# Patient Record
Sex: Male | Born: 1950 | Race: White | Hispanic: No | Marital: Married
Health system: Midwestern US, Academic
[De-identification: ages and names within clinical notes are randomized; demographics above are authoritative.]

---

## 2017-09-14 ENCOUNTER — Encounter: Admit: 2017-09-14 | Discharge: 2017-09-14 | Payer: MEDICARE | Primary: Family

## 2017-09-14 DIAGNOSIS — I10 Essential (primary) hypertension: ICD-10-CM

## 2017-09-14 DIAGNOSIS — I2510 Atherosclerotic heart disease of native coronary artery without angina pectoris: ICD-10-CM

## 2017-09-14 DIAGNOSIS — R51 Headache: Principal | ICD-10-CM

## 2017-09-14 DIAGNOSIS — G629 Polyneuropathy, unspecified: ICD-10-CM

## 2017-09-14 DIAGNOSIS — J449 Chronic obstructive pulmonary disease, unspecified: ICD-10-CM

## 2017-09-14 DIAGNOSIS — R42 Dizziness and giddiness: ICD-10-CM

## 2017-09-14 DIAGNOSIS — M1990 Unspecified osteoarthritis, unspecified site: ICD-10-CM

## 2017-09-14 DIAGNOSIS — F17200 Nicotine dependence, unspecified, uncomplicated: ICD-10-CM

## 2017-09-14 DIAGNOSIS — H538 Other visual disturbances: ICD-10-CM

## 2017-09-14 DIAGNOSIS — K219 Gastro-esophageal reflux disease without esophagitis: ICD-10-CM

## 2017-09-14 DIAGNOSIS — J302 Other seasonal allergic rhinitis: ICD-10-CM

## 2018-03-06 ENCOUNTER — Encounter: Admit: 2018-03-06 | Discharge: 2018-03-06 | Payer: MEDICARE | Primary: Family

## 2018-03-06 DIAGNOSIS — I2510 Atherosclerotic heart disease of native coronary artery without angina pectoris: Principal | ICD-10-CM

## 2018-03-13 ENCOUNTER — Encounter: Admit: 2018-03-13 | Discharge: 2018-03-13 | Payer: MEDICARE | Primary: Family

## 2018-03-13 ENCOUNTER — Ambulatory Visit: Admit: 2018-03-13 | Discharge: 2018-03-14 | Payer: MEDICARE | Primary: Family

## 2018-03-13 DIAGNOSIS — I10 Essential (primary) hypertension: ICD-10-CM

## 2018-03-13 DIAGNOSIS — R51 Headache: Principal | ICD-10-CM

## 2018-03-13 DIAGNOSIS — J449 Chronic obstructive pulmonary disease, unspecified: ICD-10-CM

## 2018-03-13 DIAGNOSIS — R079 Chest pain, unspecified: ICD-10-CM

## 2018-03-13 DIAGNOSIS — M1990 Unspecified osteoarthritis, unspecified site: ICD-10-CM

## 2018-03-13 DIAGNOSIS — R0989 Other specified symptoms and signs involving the circulatory and respiratory systems: ICD-10-CM

## 2018-03-13 DIAGNOSIS — R200 Anesthesia of skin: ICD-10-CM

## 2018-03-13 DIAGNOSIS — H538 Other visual disturbances: ICD-10-CM

## 2018-03-13 DIAGNOSIS — E7800 Pure hypercholesterolemia, unspecified: Principal | ICD-10-CM

## 2018-03-13 DIAGNOSIS — J432 Centrilobular emphysema: ICD-10-CM

## 2018-03-13 DIAGNOSIS — I2510 Atherosclerotic heart disease of native coronary artery without angina pectoris: ICD-10-CM

## 2018-03-13 DIAGNOSIS — G629 Polyneuropathy, unspecified: ICD-10-CM

## 2018-03-13 DIAGNOSIS — J302 Other seasonal allergic rhinitis: ICD-10-CM

## 2018-03-13 DIAGNOSIS — I25119 Atherosclerotic heart disease of native coronary artery with unspecified angina pectoris: ICD-10-CM

## 2018-03-13 DIAGNOSIS — F17200 Nicotine dependence, unspecified, uncomplicated: ICD-10-CM

## 2018-03-13 DIAGNOSIS — I739 Peripheral vascular disease, unspecified: ICD-10-CM

## 2018-03-13 DIAGNOSIS — R42 Dizziness and giddiness: ICD-10-CM

## 2018-03-13 DIAGNOSIS — Z720 Tobacco use: ICD-10-CM

## 2018-03-13 DIAGNOSIS — M79609 Pain in unspecified limb: ICD-10-CM

## 2018-03-13 DIAGNOSIS — K219 Gastro-esophageal reflux disease without esophagitis: ICD-10-CM

## 2018-03-14 LAB — LIPID PROFILE
Lab: 152 FL (ref 7–11)
Lab: 19 % (ref 0–5)
Lab: 3 K/UL (ref 1.8–7.0)
Lab: 47 % (ref >60)
Lab: 91 % (ref 4–12)
Lab: 97 % (ref >60)

## 2018-03-15 ENCOUNTER — Encounter: Admit: 2018-03-15 | Discharge: 2018-03-15 | Payer: MEDICARE | Primary: Family

## 2018-03-15 DIAGNOSIS — E7800 Pure hypercholesterolemia, unspecified: Principal | ICD-10-CM

## 2018-03-20 ENCOUNTER — Ambulatory Visit: Admit: 2018-03-20 | Discharge: 2018-03-20 | Payer: MEDICARE | Primary: Family

## 2018-03-20 DIAGNOSIS — M79609 Pain in unspecified limb: ICD-10-CM

## 2018-03-20 DIAGNOSIS — R0989 Other specified symptoms and signs involving the circulatory and respiratory systems: ICD-10-CM

## 2018-03-20 DIAGNOSIS — I25119 Atherosclerotic heart disease of native coronary artery with unspecified angina pectoris: ICD-10-CM

## 2018-03-20 DIAGNOSIS — I739 Peripheral vascular disease, unspecified: Secondary | ICD-10-CM

## 2018-03-20 DIAGNOSIS — E7800 Pure hypercholesterolemia, unspecified: Principal | ICD-10-CM

## 2018-03-21 ENCOUNTER — Encounter: Admit: 2018-03-21 | Discharge: 2018-03-21 | Payer: MEDICARE | Primary: Family

## 2018-03-21 DIAGNOSIS — I25119 Atherosclerotic heart disease of native coronary artery with unspecified angina pectoris: ICD-10-CM

## 2018-03-21 DIAGNOSIS — Z720 Tobacco use: ICD-10-CM

## 2018-03-21 DIAGNOSIS — Z01818 Encounter for other preprocedural examination: ICD-10-CM

## 2018-03-21 DIAGNOSIS — J439 Emphysema, unspecified: Principal | ICD-10-CM

## 2018-03-21 DIAGNOSIS — E7800 Pure hypercholesterolemia, unspecified: ICD-10-CM

## 2018-03-21 MED ORDER — ATORVASTATIN 40 MG PO TAB
40 mg | ORAL_TABLET | Freq: Every day | ORAL | 3 refills | Status: AC
Start: 2018-03-21 — End: ?

## 2018-05-16 ENCOUNTER — Encounter: Admit: 2018-05-16 | Discharge: 2018-05-16 | Payer: MEDICARE | Primary: Family

## 2018-05-16 DIAGNOSIS — E7800 Pure hypercholesterolemia, unspecified: ICD-10-CM

## 2018-05-16 DIAGNOSIS — I25119 Atherosclerotic heart disease of native coronary artery with unspecified angina pectoris: Principal | ICD-10-CM

## 2018-06-15 ENCOUNTER — Encounter: Admit: 2018-06-15 | Discharge: 2018-06-15 | Payer: MEDICARE | Primary: Family

## 2018-10-30 ENCOUNTER — Encounter: Admit: 2018-10-30 | Discharge: 2018-10-31 | Payer: MEDICARE | Primary: Family

## 2018-10-31 DIAGNOSIS — J069 Acute upper respiratory infection, unspecified: Principal | ICD-10-CM

## 2018-10-31 LAB — COVID-19 (SARS-COV-2) PCR

## 2019-09-28 ENCOUNTER — Encounter: Admit: 2019-09-28 | Discharge: 2019-09-28 | Payer: MEDICARE | Primary: Family

## 2019-09-29 ENCOUNTER — Encounter: Admit: 2019-09-29 | Discharge: 2019-09-29 | Payer: MEDICARE | Primary: Family

## 2019-09-29 NOTE — Progress Notes
69 yo M, presented to outside ED this evening after sustaining an injury to his left index finger and left thumb.  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  Provider reports that it went through the middle phalange completely but that there is some vascular flow.  Decent cap refill distally.  Patient sustained a tuft fracture to left thumb as well.  He is alert and oriented.  Refusing pain medications.  Tetanus given.      Dr. Jen Mow after consulting with provider, recommended stitching up the index finger and placing him a splint.  He asked them to get a covid swab on him as well.  Sending is to send patient contact info that I will in turn forward on to Dr. Acie Fredrickson so that his office can contact him this week to set up surgery.

## 2021-07-19 ENCOUNTER — Ambulatory Visit: Admit: 2021-07-19 | Discharge: 2021-07-19 | Payer: MEDICARE

## 2021-07-19 ENCOUNTER — Encounter: Admit: 2021-07-19 | Discharge: 2021-07-19 | Payer: MEDICARE

## 2021-07-19 DIAGNOSIS — Z8679 Personal history of other diseases of the circulatory system: Secondary | ICD-10-CM

## 2021-07-19 DIAGNOSIS — R0602 Shortness of breath: Secondary | ICD-10-CM

## 2021-12-07 ENCOUNTER — Encounter: Admit: 2021-12-07 | Discharge: 2021-12-07 | Payer: MEDICARE

## 2022-01-03 ENCOUNTER — Encounter: Admit: 2022-01-03 | Discharge: 2022-01-03 | Payer: MEDICARE

## 2022-01-25 ENCOUNTER — Encounter: Admit: 2022-01-25 | Discharge: 2022-01-25 | Payer: MEDICARE

## 2022-05-19 ENCOUNTER — Encounter: Admit: 2022-05-19 | Discharge: 2022-05-19 | Payer: MEDICARE

## 2022-05-19 DIAGNOSIS — E538 Deficiency of other specified B group vitamins: Secondary | ICD-10-CM

## 2022-05-19 DIAGNOSIS — F17200 Nicotine dependence, unspecified, uncomplicated: Secondary | ICD-10-CM

## 2022-05-19 DIAGNOSIS — I2510 Atherosclerotic heart disease of native coronary artery without angina pectoris: Secondary | ICD-10-CM

## 2022-05-19 DIAGNOSIS — H538 Other visual disturbances: Secondary | ICD-10-CM

## 2022-05-19 DIAGNOSIS — I10 Essential (primary) hypertension: Secondary | ICD-10-CM

## 2022-05-19 DIAGNOSIS — M1990 Unspecified osteoarthritis, unspecified site: Secondary | ICD-10-CM

## 2022-05-19 DIAGNOSIS — J449 Chronic obstructive pulmonary disease, unspecified: Secondary | ICD-10-CM

## 2022-05-19 DIAGNOSIS — R519 HA (headache): Secondary | ICD-10-CM

## 2022-05-19 DIAGNOSIS — K219 Gastro-esophageal reflux disease without esophagitis: Secondary | ICD-10-CM

## 2022-05-19 DIAGNOSIS — G20A1 Parkinson's disease: Secondary | ICD-10-CM

## 2022-05-19 DIAGNOSIS — G629 Polyneuropathy, unspecified: Secondary | ICD-10-CM

## 2022-05-19 DIAGNOSIS — R42 Dizziness and giddiness: Secondary | ICD-10-CM

## 2022-05-19 DIAGNOSIS — J302 Other seasonal allergic rhinitis: Secondary | ICD-10-CM

## 2022-05-23 ENCOUNTER — Encounter
Admit: 2022-05-23 | Discharge: 2022-05-23 | Payer: MEDICARE | Primary: Student in an Organized Health Care Education/Training Program

## 2022-05-23 NOTE — Telephone Encounter
Images of MRI brain, CT head, and CT c-spine requested. Reports available in CE.

## 2022-05-24 ENCOUNTER — Encounter
Admit: 2022-05-24 | Discharge: 2022-05-24 | Payer: MEDICARE | Primary: Student in an Organized Health Care Education/Training Program

## 2022-05-24 ENCOUNTER — Ambulatory Visit
Admit: 2022-05-24 | Discharge: 2022-05-24 | Payer: MEDICARE | Primary: Student in an Organized Health Care Education/Training Program

## 2022-05-24 DIAGNOSIS — G20A1 Parkinson's disease without dyskinesia or fluctuating manifestations: Secondary | ICD-10-CM

## 2022-05-24 DIAGNOSIS — J449 Chronic obstructive pulmonary disease, unspecified: Secondary | ICD-10-CM

## 2022-05-24 DIAGNOSIS — R269 Unspecified abnormalities of gait and mobility: Secondary | ICD-10-CM

## 2022-05-24 DIAGNOSIS — G259 Extrapyramidal and movement disorder, unspecified: Secondary | ICD-10-CM

## 2022-05-24 DIAGNOSIS — G629 Polyneuropathy, unspecified: Secondary | ICD-10-CM

## 2022-05-24 DIAGNOSIS — C801 Malignant (primary) neoplasm, unspecified: Secondary | ICD-10-CM

## 2022-05-24 DIAGNOSIS — H538 Other visual disturbances: Secondary | ICD-10-CM

## 2022-05-24 DIAGNOSIS — I2510 Atherosclerotic heart disease of native coronary artery without angina pectoris: Secondary | ICD-10-CM

## 2022-05-24 DIAGNOSIS — R42 Dizziness and giddiness: Secondary | ICD-10-CM

## 2022-05-24 DIAGNOSIS — K219 Gastro-esophageal reflux disease without esophagitis: Secondary | ICD-10-CM

## 2022-05-24 DIAGNOSIS — R259 Unspecified abnormal involuntary movements: Secondary | ICD-10-CM

## 2022-05-24 DIAGNOSIS — R413 Other amnesia: Secondary | ICD-10-CM

## 2022-05-24 DIAGNOSIS — R519 Generalized headaches: Secondary | ICD-10-CM

## 2022-05-24 DIAGNOSIS — F17200 Nicotine dependence, unspecified, uncomplicated: Secondary | ICD-10-CM

## 2022-05-24 DIAGNOSIS — M1990 Unspecified osteoarthritis, unspecified site: Secondary | ICD-10-CM

## 2022-05-24 DIAGNOSIS — R079 Chest pain, unspecified: Secondary | ICD-10-CM

## 2022-05-24 DIAGNOSIS — I10 Essential (primary) hypertension: Secondary | ICD-10-CM

## 2022-05-24 DIAGNOSIS — J302 Other seasonal allergic rhinitis: Secondary | ICD-10-CM

## 2022-05-24 DIAGNOSIS — E538 Deficiency of other specified B group vitamins: Secondary | ICD-10-CM

## 2022-05-24 MED ORDER — AMANTADINE HCL 100 MG PO TAB
100 mg | ORAL_TABLET | Freq: Two times a day (BID) | ORAL | 11 refills | Status: AC
Start: 2022-05-24 — End: ?

## 2022-05-24 MED ORDER — CARBIDOPA 25 MG PO TAB
25 mg | ORAL_TABLET | Freq: Three times a day (TID) | ORAL | 11 refills | Status: AC
Start: 2022-05-24 — End: ?

## 2022-05-27 ENCOUNTER — Encounter
Admit: 2022-05-27 | Discharge: 2022-05-27 | Payer: MEDICARE | Primary: Student in an Organized Health Care Education/Training Program

## 2022-05-27 NOTE — Telephone Encounter
PA for Carbidopa '25mg'$  sent to [facility] via CoverMyMeds.    Approved on December 14  PA Case: [id number], Status: Approved, Coverage Starts on: 06/13/2021 12:00:00 AM, Coverage Ends on: 06/13/2023 12:00:00 AM. Questions? Contact [phone].

## 2022-06-01 ENCOUNTER — Encounter
Admit: 2022-06-01 | Discharge: 2022-06-01 | Payer: MEDICARE | Primary: Student in an Organized Health Care Education/Training Program

## 2022-06-20 ENCOUNTER — Encounter
Admit: 2022-06-20 | Discharge: 2022-06-20 | Payer: MEDICARE | Primary: Student in an Organized Health Care Education/Training Program

## 2022-06-22 ENCOUNTER — Encounter
Admit: 2022-06-22 | Discharge: 2022-06-22 | Payer: MEDICARE | Primary: Student in an Organized Health Care Education/Training Program

## 2022-08-03 ENCOUNTER — Encounter
Admit: 2022-08-03 | Discharge: 2022-08-03 | Payer: MEDICARE | Primary: Student in an Organized Health Care Education/Training Program

## 2022-11-12 ENCOUNTER — Encounter
Admit: 2022-11-12 | Discharge: 2022-11-12 | Payer: MEDICARE | Primary: Student in an Organized Health Care Education/Training Program

## 2022-11-22 ENCOUNTER — Encounter
Admit: 2022-11-22 | Discharge: 2022-11-22 | Payer: MEDICARE | Primary: Student in an Organized Health Care Education/Training Program

## 2023-01-16 ENCOUNTER — Encounter
Admit: 2023-01-16 | Discharge: 2023-01-16 | Payer: MEDICARE | Primary: Student in an Organized Health Care Education/Training Program

## 2023-01-26 ENCOUNTER — Encounter
Admit: 2023-01-26 | Discharge: 2023-01-26 | Payer: MEDICARE | Primary: Student in an Organized Health Care Education/Training Program

## 2023-01-27 ENCOUNTER — Encounter
Admit: 2023-01-27 | Discharge: 2023-01-27 | Payer: MEDICARE | Primary: Student in an Organized Health Care Education/Training Program

## 2023-02-14 ENCOUNTER — Encounter
Admit: 2023-02-14 | Discharge: 2023-02-14 | Payer: MEDICARE | Primary: Student in an Organized Health Care Education/Training Program

## 2023-04-30 ENCOUNTER — Encounter
Admit: 2023-04-30 | Discharge: 2023-04-30 | Payer: MEDICARE | Primary: Student in an Organized Health Care Education/Training Program

## 2023-05-01 ENCOUNTER — Encounter
Admit: 2023-05-01 | Discharge: 2023-05-01 | Payer: MEDICARE | Primary: Student in an Organized Health Care Education/Training Program

## 2023-05-26 ENCOUNTER — Encounter
Admit: 2023-05-26 | Discharge: 2023-05-26 | Payer: MEDICARE | Primary: Student in an Organized Health Care Education/Training Program

## 2023-05-26 MED ORDER — AMANTADINE HCL 100 MG PO CAP
100 mg | ORAL_CAPSULE | Freq: Two times a day (BID) | ORAL | 1 refills | Status: AC
Start: 2023-05-26 — End: ?

## 2023-05-26 NOTE — Telephone Encounter
Received refill request for amantadine 100mg  capsules.   LOV 05/24/22. NOV 07/27/23.   Rx refilled electronically for 30 days with 1 refill. Dr. Nedra Hai to [place].

## 2023-07-16 ENCOUNTER — Encounter
Admit: 2023-07-16 | Discharge: 2023-07-16 | Payer: MEDICARE | Primary: Student in an Organized Health Care Education/Training Program

## 2023-07-17 ENCOUNTER — Encounter
Admit: 2023-07-17 | Discharge: 2023-07-17 | Payer: MEDICARE | Primary: Student in an Organized Health Care Education/Training Program

## 2023-07-27 ENCOUNTER — Encounter
Admit: 2023-07-27 | Discharge: 2023-07-27 | Payer: MEDICARE | Primary: Student in an Organized Health Care Education/Training Program

## 2023-07-27 ENCOUNTER — Ambulatory Visit
Admit: 2023-07-27 | Discharge: 2023-07-27 | Payer: MEDICARE | Primary: Student in an Organized Health Care Education/Training Program

## 2023-07-27 ENCOUNTER — Ambulatory Visit
Admit: 2023-07-27 | Discharge: 2023-07-28 | Payer: MEDICARE | Primary: Student in an Organized Health Care Education/Training Program

## 2023-07-27 DIAGNOSIS — G629 Polyneuropathy, unspecified: Secondary | ICD-10-CM

## 2023-07-27 DIAGNOSIS — G20A1 Parkinson's disease without dyskinesia or fluctuating manifestations (HCC): Secondary | ICD-10-CM

## 2023-07-27 DIAGNOSIS — R7989 Other specified abnormal findings of blood chemistry: Secondary | ICD-10-CM

## 2023-07-27 MED ORDER — CARBIDOPA-LEVODOPA 50-200 MG PO TBER
1 | ORAL_TABLET | Freq: Three times a day (TID) | ORAL | 11 refills | Status: AC
Start: 2023-07-27 — End: ?

## 2023-07-27 NOTE — Progress Notes
[facility]  Department of Neurology   [facility]    Date of service: 07/27/2023    Reason for visit:  Louis James is 73 y.o. right handed male who presents today for evaluation of Parkinson's Disease.  Presents with wife who aids in providing history.    LOV 05/2022  He has started getting tremor in his legs, worse on the left side. Also notes that his tremor makes it difficulty to eat at times.     He is having stomach problems, and says he cannot eat food at all due to nausea. He has an upset stomach now but hasn't eaten much at all today. All he can really tolerate is a premier protein drink, or sometimes jello, applesauce, or yogurt. Meat will make him sick to his stomach. He reports his weight has been consistent at about 125 pounds on his home scale. He does have bad problems with constipation, and says the Miralax helps only a little (does not take every day).     For the CD/LD he is continuing to take it 3 times daily, 6-8a, noon, and 6-8p. He says the noon dose is the only time he can take it down, and gets most nauseous with his morning and evening doses.    He is still taking the ropinirole 4mg  three times daily. Patient denies any symptoms of daytime sleepiness currently, but says it used to be bad where he would sleep about 18 hours per day. Now he sleeps about 12 hours per day, but mostly at night. Will occasionally doze off for a nap during the day. He says he will get up 4-5 times per night to use the bathroom. Wife says the he hollers in his dreams while sleeping, but the patient does not recall. He does not act out dreams as much as he used to.     He still falls quite often according to his wife. His walking is getting worse, and his wife says he can hardly walk very far at all. He did try some physical therapy after his last visit here, but says it didn't help at all.     Also complains of numbness and pain his toes bilaterally. He says it feels like someone hit his toes with a hammer. He says he can't feel his feet very well but always feel cold.    HPI formulated by PGY-4 Dr. 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 Affects the majority of my activities   Hallucinations/delusions: seeing, hearing, feeling or imagining things that are not there or not true: None   Depression: feeling sad, blue, hopeless, unable to enjoy things: Marked. Occurs often is persistent and may include frequent crying episodes.   Anxiety: nervous, worried, tearful or tense: Moderate. Occurs often and interferes with some  daily activities.   Apathy: loss of interest, enthusaism, or concern: Moderate. I have lost interest in all elective activities, and I often put off my typical daily activities.   Gambling: None.   Shopping: None.   Sex: None.   Pornography: None.   Eating: None.   Doing unnecessary things like emptying drawers/closets in bedroom/garage and leaving things in a mess: None.   Nighttime sleep: Mild. I fall asleep without much difficulty and may wake up during the night to go to the bathroom but have no difficulty going back to sleep.   Daytime sleepiness: Marked. I have difficulty staying awake during the day and take multiple naps.   Vivid dreams: dreams that are so clear they seem real. None.   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 Mild. I frequently talk in my sleep or act out my dreams, but this has not been disturbing for me or my sleep partner.   Restless legs syndrome: uncomfortable sensations in your legs that are uncontrollable and occur when resting, in the evenings, or when you get in bed. Moderate. I have these sensations frequently in the evening or while in bed and they affect my sleeping.   Pain or muscle cramps: Marked. I have pain in many areas of my body, it limits my activities and may require medication.   Urination: Mild. I have to go frequently, and it is bothersome, or I wake up frequently at night to go to the bathroom.   Constipation: Moderate. I have occasional constipation but can manage it with over-the-counter medication.   Dizziness or lightheadedness: Marked. I feel like I am going to pass out when I am standing or walking.   Tiredness/Fatigue: Moderate. I am often tired and frequently I am not able to complete some of my tasks/activities.   Falling: Moderate. I fall occasionally.   Typically, how many times per month? na   Personal Care: Moderate. I have some difficulty and need some help daily.   Assistive devices for getting around: Dan Humphreys   Are you taking any form of levodopa? Yes   Do you have OFF time while you are awake during the day? OFF time is time during the day when your Parkinson's medications are not working or not working as well as usual. Yes, I have OFF time   If yes, how many hours per day? all day   If yes, how many episodes per day? allday   Do you have dyskinesia while you are awake during the day? Dyskinesia refers to involuntary movements that can cause wiggling or twisting movements of the head, hands, legs, trunk, face, jaw, or any other part of the body. Yes   If yes, how many hours per day? 8   If yes, how many episodes per day? all day   Employment: On disability and cannot work at all.             Medications:   Current Outpatient Medications on File Prior to Visit   Medication Sig Dispense Refill    albuterol 0.083% (PROVENTIL; VENTOLIN) 2.5 mg /3 mL (0.083 %) nebulizer solution Inhale 3 mL solution by nebulizer as directed every 6 hours as needed.      albuterol-ipratropium (DUONEB) 0.5 mg-3 mg(2.5 mg base)/3 mL nebulizer solution Inhale  solution by nebulizer as directed every 4 hours as needed.      amantadine hcl (SYMMETREL) 100 mg capsule Take 1 capsule by mouth twice daily 60 capsule 0  aspirin EC (ASPIR-LOW) 81 mg tablet Take one tablet by mouth daily.      atorvastatin (LIPITOR) 40 mg tablet Take one tablet by mouth daily. 90 tablet 3    beclomethasone(+) (QVAR) 80 mcg/actuation inhaler Inhale two puffs by mouth into the lungs twice daily.      carbidopa/levodopa (SINEMET) 25/100 mg tablet Take two tablets by mouth three times daily.      fluticasone-umeclidin-vilanter (TRELEGY ELLIPTA) 100-62.5-25 mcg inhaler Inhale  by mouth into the lungs daily.      losartan (COZAAR) 25 mg tablet Take one tablet by mouth daily.      multivit-min/folic/vit K/lycop (MEN'S MULTIVITAMIN PO) Take 1 capsule by mouth daily.      nitroglycerin (NITROSTAT) 0.4 mg tablet Place 1 tablet under tongue every 5 minutes as needed for Chest Pain. Max of 3 tablets, call 911. 25 tablet 3    pantoprazole DR (PROTONIX) 40 mg tablet Take one tablet by mouth daily.      potassium chloride (KLOR-CON SPRINKLE) 10 mEq capsule TAKE 1 CAPSULE BY MOUTH ONCE DAILY FOR HYPOKALEMIA      rOPINIRole (REQUIP) 4 mg tablet Take one tablet by mouth three times daily.       No current facility-administered medications on file prior to visit.       Past Medical History:    Past Medical History:    Abnormal involuntary movement    Acid reflux    Arthritis    B12 deficiency    Blurry vision    CAD (coronary artery disease)    Cancer (HCC)    Chest pain    COPD (chronic obstructive pulmonary disease) (HCC)    Dizziness    Generalized headaches    HA (headache)    Hypertension    Memory loss    Movement disorder    Parkinson's disease (HCC)    Peripheral neuropathy    Seasonal allergies    Smoker Social History:    Social History     Socioeconomic History    Marital status: Married   Tobacco Use    Smoking status: Every Day     Current packs/day: 0.25     Average packs/day: 0.3 packs/day for 46.7 years (12.0 ttl pk-yrs)     Types: Cigarettes    Smokeless tobacco: Former     Types: Chew     Quit date: 06/14/1975    Tobacco comments:     at heaviest, smoked 2.5 ppd   Substance and Sexual Activity    Alcohol use: Yes     Alcohol/week: 2.0 standard drinks of alcohol     Types: 1 Glasses of wine, 1 Cans of beer per week     Comment: every night    Drug use: No       Family History:   Family History   Problem Relation Name Age of Onset    Pacemaker Mother      Pacemaker Father      Stroke Father  66    Diabetes Brother      Cancer Brother          Allergies:   Allergies   Allergen Reactions    Codeine STOMACH UPSET and VOMITING       PHYSICAL EXAMINATION:      VITAL SIGNS:   Vitals:    07/27/23 1346 07/27/23 1351   BP: (!) 155/69 104/71   BP Source: Arm, Right Upper Arm, Left Upper   Patient Position: Sitting Standing  Pulse: 92 94       Body mass index is 21.14 kg/m?Marland Kitchen    NEUROLOGICAL EXAM:  MS: Alert, awake, cooperative. Speech fluent without paraphasic errors. Able to provide accurate history, remote memory intact. Good fund of knowledge.  Sensory: reduced to LT, PP, temperature, vibration, proprioception below knees      Movement disorders exam  Mod hypophonia  Mod facial masking  R > L resting tremor  R > L postural and action tremor   R > L rigidity  R> L  bradykinesia with finger taps, supination/pronation, hand grips, or foot taps/stomps  No dystonic posturing  Stands up unassisted, using arms to push off  Posture very stooped   Gait is slow, shuffling, narrow based, unsteady with poor stride length and arm swing     UPDRS Motor:   Speech: 2 - Monotone, slurred but understandable- moderately impaired.  Facial Expression: 2 - Slight but definitely abnormal diminution of facial expression.  Tremor at Rest Jaw: 1 - Slight and infrequently present.  Tremor at Rest RUE: 3 - Moderate in amplitude and present most of the time.  Tremor at Rest LUE: 2 - Moderate in amplitude, but only intermittently present.  Tremor at Rest RLE: 1 - Slight and infrequently present.  Tremor at Rest LLE: 1 - Slight and infrequently present.  Action of Postural Tremor of Hands R: 1 - Slight and infrequent.  Action of Postural Tremor of Hands L: 1 - Slight and infrequent.  Rigidity NECK: 2 - Mild to moderate.  Rigidity RUE: 2 - Mild to moderate.  Rigidity LUE: 1 -  Slight or detectable only when activated by mirror or other movements.  Rigidity RLE: 2 - Mild to moderate  Rigidity LLE: 2 - Mild to moderate.  Finger Taps L: 1 - Mild slowing and/or reduction in amplitude (11-14/5 sec)  Hand Movements R: 2 - Moderately impaired. Definite and early fatiguing. May have occasional arrests in movement.  Hand Movements L: 1 - Mild slowing and/or reduction in amplitude.  Rapid Alternating Movement of Hands R: 2 - Moderately impaired. Definite and early fatiguing. May have occasional arrests in movement.  Rapid Alternating Movements of Hands L: 1 - Mild slowing and/or reduction in amplitude.  Leg Agility with Knee Bent R: 2 - Moderately impaired. Definite and early fatiguing.  Leg Agility with Knee Bent L: 2 - Moderately impaired. Definite and early fatiguing.  Arising From Chair: 3 - Tends to fall back and may have to try more than one time, but can get up without help.  Posture: 3 - Severely stooped posture with kyphosis- can be moderately leaning to one side.  Gait: 2 - Walks with difficulty, but requires little or no assistance- may have some festination short steps, or propulsion.  Postural Stability: 0 - Normal  Body Bradykinesia and Hypokinesia: 3 - Moderate slowness, poverty or small amplitude of movement.  Total Motor Exam: 47      Labs/Imaging/Procedures:      Assessment/Plan:  Louis James is a 73 y.o. right handed male who presents today for evaluation of Parkinson's Disease symptom onset 2018 R hand tremor. Exam reveals a moderate - severe RUE resting tremor, mild rigidity/bradykinesia. Course c/b multiple orthopedic issues, h/o spinal cord injury, diffuse pain with gait disturbance , likely nausea from levodopa with weight loss ( GI workup unremarkable) , EDS (likely from ropinirole , better )   Exam reveals significant neuropathy below knees     CR formulation may be better  tolerated than IR     Plan:  STOP carbidopa/levodopa IR 25/100mg      START carbidopa/levodopa CR 50/200mg  1 tab 3x/day     Continue amantadine 100 mg 2x/day   Continue ropinirole 4 mg 3x/day     Labwork today to work up neuropathy    Consider gabapentin or lyrica to treat neuropathy - defers , doesn't want to add more medications     STAY HYDRATED - aim to drink at least 60 oz fluids/day     EXERCISE REGULARLY!     Declines another PT referral     Consider FUS vs. Deep Brain Stimulation - brochure provided at initial visit     Follow up in 6-8 months      ICD-9-CM ICD-10-CM    1. Parkinson's disease without dyskinesia or fluctuating manifestations (HCC)  332.0 G20.A1 carbidopa-levodopa CR (SINEMET CR) 50/200 mg tablet      VITAMIN B12      HEMOGLOBIN A1C      ELECTROPHORESIS-SERUM PROTEIN      IMMUNOFIXATION, SERUM (IFES)      2. Neuropathy  355.9 G62.9 carbidopa-levodopa CR (SINEMET CR) 50/200 mg tablet      VITAMIN B12      HEMOGLOBIN A1C      ELECTROPHORESIS-SERUM PROTEIN      IMMUNOFIXATION, SERUM (IFES)      3. Other specified abnormal findings of blood chemistry  790.6 R79.89 HEMOGLOBIN A1C             Beatrix Shipper, MD  Assistant Professor of Neurology  [facility]  [facility]        I spent a total of 30 minutes on this patient's care on the day of their visit excluding time spent related to any billed procedures. This time includes face-to-face time with the patient as well as time spent documenting in the medical record, reviewing patient's records and tests, obtaining history, placing orders, communicating with other healthcare professionals, counseling the patient, family, or caregiver, and/or care coordination for the diagnoses above.

## 2023-08-03 ENCOUNTER — Encounter
Admit: 2023-08-03 | Discharge: 2023-08-03 | Payer: MEDICARE | Primary: Student in an Organized Health Care Education/Training Program

## 2023-08-11 ENCOUNTER — Encounter
Admit: 2023-08-11 | Discharge: 2023-08-11 | Payer: MEDICARE | Primary: Student in an Organized Health Care Education/Training Program

## 2023-08-11 MED ORDER — AMANTADINE HCL 100 MG PO CAP
100 mg | ORAL_CAPSULE | Freq: Two times a day (BID) | ORAL | 5 refills | Status: AC
Start: 2023-08-11 — End: ?

## 2023-08-11 NOTE — Telephone Encounter
 Received refill request for amantadine 100mg  BID. Medication included in 07/27/23 LOV plan of care. Next OV scheduled for 02/13/24. Rx refilled electronically. Dr. Nedra Hai to [place].

## 2023-08-15 ENCOUNTER — Encounter
Admit: 2023-08-15 | Discharge: 2023-08-15 | Payer: MEDICARE | Primary: Student in an Organized Health Care Education/Training Program

## 2023-08-15 NOTE — Telephone Encounter
 Return call to PCP Dr. Dwyane Luo about pt's worsening cognitive status   Getting more mixed up , confused, perseverating on things   Scored poorly on cognitive testing in office    Dr. Darcella Gasman asking whether we could start donepezil or rivastigmine - yes     Discussed his B12 was low - normal in the 300's and suggested supplementation     Dr. Darcella Gasman states he is a vasculopath and continues to smoke , all likely contributing to cognitive decline

## 2023-12-08 ENCOUNTER — Encounter: Admit: 2023-12-08 | Discharge: 2023-12-08 | Payer: MEDICARE

## 2023-12-11 ENCOUNTER — Encounter: Admit: 2023-12-11 | Discharge: 2023-12-11 | Payer: MEDICARE

## 2023-12-11 NOTE — Telephone Encounter
 12/11/23 Records requested per [facility] note below CRM      PCP- Carlyon Favors, [address], ph: [phone] fax: [phone]

## 2023-12-27 ENCOUNTER — Ambulatory Visit: Admit: 2023-12-27 | Discharge: 2023-12-27 | Payer: MEDICARE

## 2023-12-27 ENCOUNTER — Encounter: Admit: 2023-12-27 | Discharge: 2023-12-27 | Payer: MEDICARE

## 2024-01-16 ENCOUNTER — Encounter: Admit: 2024-01-16 | Discharge: 2024-01-16 | Payer: MEDICARE

## 2024-02-10 ENCOUNTER — Encounter: Admit: 2024-02-10 | Discharge: 2024-02-10 | Payer: MEDICARE

## 2024-02-10 NOTE — Progress Notes
 CARDIAC NEW PATIENT PROFILE      PHYSICIANS INFORMATION:   REFERRING PHYSICIAN:    PCP: Bridgette Lapine, MD     REASON FOR VISIT/DIAGNOSIS:   Angina pectoris    RECENT EVENTS/SYMPTOMS:     12/01/2023 PCP OV NOTE  HPI:  Louis James is a 73 year old Caucasian/White male who presents to [facility] for a problem visit.     Chest Pain  He experiences, chest pain that feels deep and radiates to his back, particularly when taking a deep breath. The pain is similar to previous episodes associated with his heart issues. He has a history of two heart attacks, with the most recent occurring around 2020 or 2021, and has had two stents placed in the past. The chest pain is exacerbated by heat, which also causes dizziness.    COPD  He has a history of COPD and reports difficulty breathing, especially in hot weather. He uses albuterol via a nebulizer, but is not using a daily inhaler like trilogy due to cost. The nebulizer helps but does not fully control his symptoms. He sleeps with the fan blowing on his face to aid breathing at night.    MEDICATION MANAGEMENT  He is not currently taking a muscle relaxer as it was not effective. He is also not taking prostate medication as it did not improve his symptoms of frequent urination, which occurs three times a night. He takes aspirin regularly and has reduced his cholesterol medication to one pill daily, which has improved his gastrointestinal symptoms.    ASSESSMENT AND PLAN:  Current smoker  - chronic  - current smoker, 50 pack years  - will monitor and continue to encourage cessation  Heart failure with preserved ejection fraction  - chronic  - Per echo 07/20/2021: mild concentric LVH. Diastolic function was indeterminate.   - Recent ER visit ruled out acute myocardial infarction but suggested ongoing cardiac issues.  - He is open to cardiology evaluation, including catheterization and stenting.  - Prescribe nitroglycerin for chest pain management, instruct to use at rest due to potential dizziness and hypotension.  - Monitor  COPD  - chronic  - RX albuteral sulfate 108 (90 Base), Incruse Ellipta  - Dyspnea exacerbated by heat. Use albuterol and ipratropium via nebulizer.  - Oxygen therapy no indicated due to normal oxygen levels.   - Arranged nebulizer medication through a DME company for insurance coverage.   - Prescribe ipratropium and albuterol for nebulizer use.   - Educate on the importance of using both medication's to manage COPD symptoms. - Monitor.    07/13/2023 ED PROVIDER NOTE  MEDICAL DECISION MAKING:  Diagnostic considerations and differential diagnoses:  Patient presents to the emergency department due to complaint of shortness of breath.  He does have a history of COPD.  He states that he recently finished Augmentin, azithromycin and a prednisone burst for pneumonia and COPD exacerbation.  He completed his medications on Monday.  He states that his breathing is now getting worse again.  Intermittent sputum production with cough.  Denies fever and chills.  States that he is using his nebulizer 3 times per day at home.  He does not feel like this is helping at all.  Patient also complains of right 2nd toe discoloration this morning but notes that it has now gone back to normal.  He states that his right 2nd toe intermittently changes color.  No pain.  Differential diagnosis includes but is not limited to COPD exacerbation,  persistent pneumonia, viral syndrome, PE.  Patient does have diffuse wheezing on exam.  He is not in respiratory distress.  Labs overall reassuring.  Viral panel is negative.  D-dimer elevated but CT angiogram does not show PE.  Evidence of bronchitis.  Given recent treatment with antibiotics, no indication for re-treatment.  We will provide long steroid taper.  Discussed admission versus going home with steroid taper with wife and patient at the bedside.  Both feel comfortable with outpatient management which given no respiratory distress and no hypoxia as well as reassuring evaluation in the emergency department I feel is appropriate.  Return precautions provided.  Patient and wife verbalized understanding and agreement with the plan of care.    05/18/2021 [facility]  HPI:  Louis James 73 y.o. male presents today for management of his cardiovascular disease.     He has known history of coronary artery disease with branch vessel disease by cardiac cath in 2017 at [facility]. Medical management was recommended at that time.He initially presented to [facility] in July 2020 with CHF and COPD exacerbation.     He was treated with steroids and IV diuretics, lost about 3 kg during the hospitalization, discharge weight about 147 pounds. He was discharged on Lasix 40 mg daily.     He underwent an outpatient stress test that was abnormal suggesting RCA territory ischemia. He was admitted for elective cardiac cath and underwent intervention to RCA in 11/2018. He was discharged on aspirin, statin, Effient in addition to Lasix. He had moderate OM disease for which medical management was recommended. He presented with recurrent chest discomfort and arm pain in October 2020, he was ruled out for acute coronary syndrome however due to concerning anginal symptoms he underwent cardiac cath that showed subtotally occluded large OM vessel, intervened with drug-eluting stents.     He had multiple ER visits for chest pain rule out for ACS and was treated for COPD exacerbation.  His Effient was discontinued in September 2021 year of dual antiplatelet therapy in anticipation of neurosurgery.  He has severe spinal stenosis and follows with Dr. Sheena, he recently saw Dr. Vicenta in [place] in July 2022 and cervical surgery was planned. However he has to be nicotine free for 6 weeks and he has been unable to quit.     Georgette has chronic issues with dizziness, he has been orthostatic in the past. He has had diltiazem discontinued, he is not on a beta-blocker for this reason as well.    I Performed orthostatic blood pressures in the office, laying he is 151/82 with a pulse of 98 sitting he was 153/88 with a pulse of 98 and standing he dropped 128/86 with a pulse of 98. As Dr. Thomasena also for her assistance in helping to treat tach. She came in and we did a second set of orthostatic blood pressures, he laid down on the table assessed JVD which was not present, his blood pressures were again 150s when lying down. When he did sit up his blood pressure dropped into the 130s. When he went from laying to sitting he had a near syncopal episode, his eyes did not focus and it took him 1 to 2 seconds to answer questions again.    We discussed in detail with tach unfortunately he has multiple reasons for dizziness, including Parkinson's he does have orthostatic hypotension. He also has known severe cervical spinal stenosis. He is very frustrated by his dizziness. We will discontinue his  Imdur, and also cut back his furosemide to 40 mg daily, he did verbalize that if he gains fluid weight he will need to take an additional Lasix again.    He does have persistent shortness of breath, he has inspiratory and expiratory wheezes on exam. He has known COPD and does continue to smoke approximately 5 cigarettes/day. He also has some persistent abdominal bloating, despite not eating very much. He has gained 7 pounds in the last 3 months. However we do not think that this is related to fluid overload, but seems likely more related to a GI issue. I will reach out to [facility] as he may be a good candidate for an EGD. I also gave the patient their phone number and also encouraged him to follow-up with them as well.    As he does have symptoms of dizziness and lightheadedness when standing and when sitting, we did encourage him not to drive today as this seems to be a hazard and risk. I am not sure that patient will comply with this, but it was encouraged.    He has been followed by his primary care physician who is also helping to work-up his issues. He has a carotid ultrasound later this week, and also has follow-up with ENT as well.    Social history:   Tobacco use: reports that he has been smoking cigarettes. He has a 50.00 pack-year smoking history. He has quit using smokeless tobacco. His smokeless tobacco use included chew. . ASSESSMENT AND PLAN:  1. Primary hypertension  B/p checked while sitting was 153/82, this was checked standing it dropped to 128/86. He had a second set of orthostatic vital signs which essentially replicated this, he also had a near syncopal episode when going from laying to sitting.  -- Stop Imdur  -- Reduce furosemide to 40 mg daily from 60 mg daily    2. Coronary artery disease involving native coronary artery of native heart without angina pectoris  Status post drug-eluting stent to RCA in July 2020, recurrent anginal symptoms and had drug-eluting stent to the OM in October 2020. He had some recurrent chest discomfort and had cardiac cath in February 2022, his stents were patent he does have some persistent mild to moderate nonobstructive disease.  -- Continue aspirin 81 mg daily and atorvastatin  80 mg daily  He is not on a beta-blocker due to severe COPD and orthostatic hypotension, calcium channel blockers been discontinued due to hypotension  He is not on Effient since he is a fall and bleeding risk    3. Acute on chronic diastolic (congestive) heart failure (HCC)  He does have persistent shortness of breath, which is most likely related to his COPD. He also has significant abdominal distention his weight today is up 7 pounds from 3 months ago, I do not believe this is related to CHF.  His last creatinine in November was 1.3 which is up from previous of 1.1  --- Reduce Lasix to 40 mg daily, he may need to take an additional 20 if he has swelling.    4. Dizziness  This continues to be a significant issue of for him, this may be related to several factors including Parkinson's severe cervical stenosis contributing his orthostatic hypotension. His primary care physician has been trying to look into this as well, he does have a carotid ultrasound scheduled in the next few days and also has an appointment with ENT.  This is quite severe, he sometimes has dizziness episodes even  sitting and while driving. He was recommended that he may not be safe to drive, however patient was reluctant to hear this information.    5. Abdominal distention  He continues to have abdominal distention and anorexia, his weight is up 7 pounds from last office visit 3 months ago. He did previously have an EGD in December 2021 for Dr. Benjie was noted to have erosive gastritis was recommended that he have a repeat EGD in 1 month but I do not believe he has had follow-up. I did give patient the contact information, and also sent a message to his PCP and GI nurse practitioner    6. Cigarette smoking  He has been working towards quitting smoking but does continue to smoke 5 cigarettes/day. He describes stress has been the most difficult reason he has not been able to quit.  In order to qualify for cervical surgery he will have to be nicotine free for 6 weeks.     PERTINENT CARDIAC HISTORY:   Coronary artery disease  Dizziness  Hypertension  Hypercholesterolemia  Chest pain  CHF with normal EF    OTHER MEDICAL HISTORY:   Acid reflux  Arthritis  Cancer  Severe COPD  Parkinson's disease  Tobacco abuse  Lumbar stenosis    MOST RECENT PERTINENT TESTING/PROCEDURES:     12/27/2023 ECHOCARDIOGRAM  INTERPRETATION:  Left ventricular chamber size and wall thicknesses are normal  Normal left ventricular systolic function with estimate ejection fraction of 55%  Normal right ventricular chamber size and function  Normal atrial sizes  No hemodynamically significant valve abnormalities were identified  No pericardial effusion    07/13/2023 CTA CHEST   IMPRESSION:   1. No evidence of acute pulmonary embolism.   2. Mild bibasilar subsegmental atelectasis.  Mild bronchial wall   thickening suggestive of bronchitis.  No acute pulmonary infiltrate.   3. Moderate emphysema.     07/13/2023 CHEST 1 VIEW  IMPRESSION:   Small right pleural effusion    05/27/2021 DUPLEX CAROTID BILATERAL  IMPRESSION:   No evidence of hemodynamically significant stenosis.     07/20/2020 CARDIAC CATHETERIZATION  CARDIAC CATH REPORT (07/20/2020 11:10)   CONCLUSIONS   Mild to moderate nonobstructive coronary atherosclerosis.   Patent stents OM2 and RCA.   Normal left ventricular systolic function.     03/19/2019 CARDIAC CATHETERIZATION  CARDIAC CATH REPORT (03/19/2019 09:14)   CONCLUSIONS:  The patient was brought to the cardiac catheterization laboratory with ongoing chest pain without enzymatic evidence of myocardial infarction or EKG changes, diagnostic coronary angiography demonstrated the development of a subtotal occlusion of the major lateral obtuse marginal branch of the dominant circumflex system.  This stenosis was successfully stented open with a drug-eluting stent as described above. The patient's balanced right coronary artery demonstrated a patent stent in the mid vessel and there was moderate disease in the very proximal right coronary artery estimated to be 50 to 60%.  This disease was felt to be stable as compared to the previous angiogram performed a few months ago.     01/02/2019 CARDIAC CATHETERIZATION  CARDIAC CATH REPORT (01/02/2019 13:31)   CONCLUSIONS   Coronary atherosclerosis, with 90% mid RCA, 50 to 60% OM 2, and mild disease elsewhere.   Left ventricular systolic function at the lower  limits of normal.  LVEF= 55%.   Successful PTCA/DES of RCA with reduction in stenosis from 90% to 0%.     03/20/2018 ABI ARTERIAL COMPLETE  PV ABI ARTERIAL COMPLETE (AO + LOWER EXTRM) (03/20/2018 09:59)  02/04/2016 STRESS TEST  REGADENOSON SESTAMIBI MPI STRESS TEST (02/04/2016 16:39)   CONCLUSION:  Pharmacologic stress ECG is negative for ischemia.     PERTINENT CARDIAC FAMILY HISTORY:   Father: pacemaker, stroke  Mother: pacemaker  Brother: diabetes    LAB: bookmarked  Component  Ref Range & Units 05/19/20 1210   Cholesterol  <201 mg/dL 887   Triglycerides  20 - 170 mg/dL 62   HDL Cholesterol  40 - 60 mg/dL 56      LDL Cholesterol, Direct  <100 mg/dL 46        RECORDS: bookmarked; care everywhere    IMAGES: 07/13/2023 EKG available in Care Everywhere under Results

## 2024-02-14 ENCOUNTER — Encounter: Admit: 2024-02-14 | Discharge: 2024-02-14 | Payer: MEDICARE

## 2024-02-15 ENCOUNTER — Encounter: Admit: 2024-02-15 | Discharge: 2024-02-15 | Payer: MEDICARE

## 2024-02-15 ENCOUNTER — Ambulatory Visit: Admit: 2024-02-15 | Discharge: 2024-02-15 | Payer: MEDICARE

## 2024-02-15 DIAGNOSIS — I5033 Acute on chronic diastolic (congestive) heart failure: Secondary | ICD-10-CM

## 2024-02-15 DIAGNOSIS — Z136 Encounter for screening for cardiovascular disorders: Principal | ICD-10-CM

## 2024-02-15 NOTE — Progress Notes
 Date of Service: 02/15/2024    Louis James is a 73 y.o. male.       HPI   Louis James is followed for what appears to be very severe Parkinson's disease.  His Parkinson's disease appears to be worsening and he is followed in the Parkinson's center at [facility].  His ambulation is becoming quite limited and he does report some dyspnea with exertion.  He reports losing 50 pounds over the past 2 years due to lack of appetite.  He also has chronic constipation.  Usually he goes 3 to 4 days without having a bowel movement.  He does have point discomfort in his right mid to upper abdominal area which is tender to touch and improves after a bowel movement.  It is probably related to his chronic constipation.  Stenting of the first obtuse marginal coronary artery was performed on 03/19/2019 for what sounds like unstable angina without elevated cardiac markers.  Coronary angiography performed at outside facility on 07/20/2020 showed mild to moderate diffuse coronary disease that did not require intervention.  Otherwise, Louis James reports no angina, congestive symptoms, palpitations, sensation of sustained forceful heart pounding, lightheadedness or syncope.  His exercise tolerance appears to be decreasing related to his Parkinson's disease.  The patient reports no myalgias, bleeding abnormalities, claudication or strokelike symptoms.  He does report chronic neuropathic discomfort in his lower extremities.         Vitals:    02/15/24 1051   BP: 128/78   BP Source: Arm, Left Upper   Pulse: 97   SpO2: 96%   O2 Device: None (Room air)   PainSc: Zero   Weight: 54.3 kg (119 lb 9.6 oz)   Height: 172.7 cm (5' 8)     Body mass index is 18.19 kg/m?Louis James     Past Medical History  Patient Active Problem List    Diagnosis Date Noted    Acute on chronic diastolic (congestive) heart failure (CMS-HCC) 02/15/2024    CAD (coronary artery disease) 02/17/2016     60-70% OM3 lesion.       Pre-op exam 01/28/2016    Tobacco abuse 01/28/2016 Pure hypercholesterolemia 01/28/2016    Lumbar stenosis 01/05/2016     Added automatically from request for surgery [id number]      Chest pain 09/23/2015    Numbness and tingling 09/23/2015     Right side of body for several months, -hurts more upon ROM, Palpation and coughing, MRI brain 08/2015 mild chronic small vessel disease changes w/o evidence of acute intracranial abnormality      Hypertension 09/23/2015    COPD (chronic obstructive pulmonary disease) (CMS-HCC) 09/23/2015     Recent CT chest =mild emphysema 05/2015             Review of Systems   Constitutional: Negative.   HENT: Negative.     Eyes: Negative.    Cardiovascular: Negative.    Respiratory: Negative.     Endocrine: Negative.    Hematologic/Lymphatic: Negative.    Skin: Negative.    Musculoskeletal: Negative.    Gastrointestinal: Negative.    Genitourinary: Negative.    Neurological: Negative.    Psychiatric/Behavioral: Negative.     Allergic/Immunologic: Negative.      Physical Exam  GENERAL: The patient is resting comfortably and in no distress but appears to be deconditioned.    HEENT: No abnormalities of the visible oro-nasopharynx, conjunctiva or sclera are noted.  NECK: There is no jugular venous distension. Carotids are palpable and  without bruits. There is no thyroid enlargement.  Chest: Lung Louis are clear to auscultation. There are no wheezes or crackles.  CV: There is a regular rhythm. The first and second heart sounds are normal. There are no murmurs, gallops or rubs.  ABD: The abdomen is soft and supple with normal bowel sounds.  He has abdominal point tenderness in his right mid to upper abdominal area that is reproduced with palpation.  There is no guarding, rebound or peritoneal signs.  Bowel sounds are good.  No hepatomegaly is noted.    Neuro: Marked parkinsonian tremor.  Cogwheel rigidity.  Parkinsonian gait.  Ext: There is no edema or evidence of deep vein thrombosis. Peripheral pulses are satisfactory.    SKIN: There are no rashes and no cellulitis  PSYCH: The patient is calm, rationale and oriented.    Cardiovascular Studies  A twelve-lead ECG obtained on 02/15/2024 shows normal sinus rhythm with a heart rate of 91 bpm left axis deviation is noted.  Typical parkinsonian tremor is noted throughout the EKG.  There is no evidence of myocardial ischemia or infarction.  Echo Doppler 12/27/2023:  Interpretation Summary  Left ventricular chamber size and wall thicknesses are normal  Normal left ventricular systolic function with estimate ejection fraction of 55%  Normal right ventricular chamber size and function  Normal atrial sizes  No hemodynamically significant valve abnormalities were identified  No pericardial effusion  Cardiovascular Health Factors  Vitals BP Readings from Last 3 Encounters:   02/15/24 128/78   12/27/23 107/80   07/27/23 104/71     Wt Readings from Last 3 Encounters:   02/15/24 54.3 kg (119 lb 9.6 oz)   12/27/23 53.1 kg (117 lb)   07/27/23 59.4 kg (131 lb)     BMI Readings from Last 3 Encounters:   02/15/24 18.19 kg/m?   12/27/23 17.79 kg/m?   07/27/23 21.14 kg/m?      Smoking Tobacco Use History[1]   Lipid Profile Cholesterol   Date Value Ref Range Status   03/14/2018 152  Final     HDL   Date Value Ref Range Status   03/14/2018 47  Final     LDL   Date Value Ref Range Status   03/14/2018 91  Final     Triglycerides   Date Value Ref Range Status   03/14/2018 97  Final      Blood Sugar Hemoglobin A1C   Date Value Ref Range Status   07/27/2023 5.9 (H) 4.0 - 5.7 % Final     Comment:     The ADA recommends that most patients with type 1 and type 2 diabetes maintain an A1c level <7%.     Glucose   Date Value Ref Range Status   07/13/2023 93  Final   02/17/2016 91 70 - 100 MG/DL Final   92/74/7982 872 (H) 70 - 100 MG/DL Final          Problems Addressed Today  Encounter Diagnoses   Name Primary?    Screening for heart disease Yes    Acute on chronic diastolic (congestive) heart failure (CMS-HCC)        Assessment and Plan Louis James has no evidence of congestive heart failure on examination and he reports no symptoms suggestive for angina or an anginal variant.  He definitely has abdominal point tenderness which improves after a bowel movement.  The patient reports that he is getting ready to consult again with his Parkinson's specialist.  His Parkinson's disease appears  to be his greatest problem.  He also needs to work on his nutrition and trying to maintain regular bowel movements.  His blood pressure is well-controlled and he is not interested in lipid-lowering therapy.  I reviewed his recent echo cardiogram and ECG with him.  I have asked him to return for follow-up in 1 years time. The total time spent during this interview and exam with preparation and chart review was 60 minutes.         Current Medications (including today's revisions)   albuterol 0.083% (PROVENTIL; VENTOLIN) 2.5 mg /3 mL (0.083 %) nebulizer solution Inhale 3 mL solution by nebulizer as directed every 6 hours as needed.    albuterol-ipratropium (DUONEB) 0.5 mg-3 mg(2.5 mg base)/3 mL nebulizer solution Inhale  solution by nebulizer as directed every 4 hours as needed.    beclomethasone(+) (QVAR) 80 mcg/actuation inhaler Inhale two puffs by mouth into the lungs twice daily.    carbidopa -levodopa  CR (SINEMET  CR) 50/200 mg tablet Take one tablet by mouth three times daily.    losartan (COZAAR) 25 mg tablet Take one tablet by mouth daily.    nitroglycerin (NITROSTAT) 0.4 mg tablet Place 1 tablet under tongue every 5 minutes as needed for Chest Pain. Max of 3 tablets, call 911.    rOPINIRole (REQUIP) 4 mg tablet Take one tablet by mouth three times daily.                 [1]   Social History  Tobacco Use   Smoking Status Every Day    Current packs/day: 0.25    Average packs/day: 0.3 packs/day for 46.7 years (12.0 ttl pk-yrs)    Types: Cigarettes   Smokeless Tobacco Former    Types: Chew    Quit date: 06/14/1975   Tobacco Comments    at heaviest, smoked 2.5 ppd

## 2024-02-19 ENCOUNTER — Encounter: Admit: 2024-02-19 | Discharge: 2024-02-19 | Payer: MEDICARE

## 2024-02-23 ENCOUNTER — Encounter: Admit: 2024-02-23 | Discharge: 2024-02-23 | Payer: MEDICARE

## 2024-02-23 ENCOUNTER — Ambulatory Visit: Admit: 2024-02-23 | Discharge: 2024-02-24 | Payer: MEDICARE

## 2024-02-23 DIAGNOSIS — G629 Polyneuropathy, unspecified: Principal | ICD-10-CM

## 2024-02-23 DIAGNOSIS — G20A1 Parkinson's disease without dyskinesia or fluctuating manifestations (CMS-HCC): Secondary | ICD-10-CM

## 2024-02-23 MED ORDER — CARBIDOPA-LEVODOPA 50-200 MG PO TBER
2 | ORAL_TABLET | Freq: Three times a day (TID) | ORAL | 11 refills | Status: AC
Start: 2024-02-23 — End: ?

## 2024-02-23 NOTE — Progress Notes
 [facility]  Department of Neurology   [facility]    Date of service: 02/23/2024    Reason for visit:  Louis James is 73 y.o. right handed male who presents today for evaluation of Parkinson's Disease.  Presents with wife who aids in providing history.    LOV 07/2023  Tremor worsening, can hardly use a utensil to feed himself     Transitioned to CR formulation - tolerating better than IR   Levodopa  does not seem to help much with tremor    Does not think ropinirole helps at all with his tremor, and causes nausea     PCP weaned him off meds that may be worsening cognition , causing nausea     Meds:  Ropinirole 4 mg TID  carbidopa /levodopa  CR 50/200mg  TID 8 / 1 / 9     Prior trials-  Carbidopa  - never started, too expensive   CL IR - nausea   Amantadine  - PCP discontinued due to memory concerns    SH: retired Visual merchandiser, [employer], truck Hospital doctor, worked at a [employer] in [place] (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 Definitely affects some of my daily activities.   Hallucinations/delusions: seeing, hearing, feeling or imagining things that are not there or not true: None   Depression: feeling sad, blue, hopeless, unable to enjoy things: Marked. Occurs often is persistent and may include frequent crying episodes.   Anxiety: nervous, worried, tearful or tense: Marked. Occurs often and interferes with many activities.   Apathy: loss of interest, enthusaism, or concern: Severe. I don?t feel like doing anything including any of my daily activities, hobbies, or social events.   Gambling: Mild. Impulsive behaviors occur occasionally but have not caused a problem for me but may be a problem for my family.   Shopping: Mild. Impulsive behaviors occur occasionally but have not caused a problem for me but may be a problem for my family.   Sex: Slight. I have urges but I can control them.   Pornography: Slight. I have urges but I can control them.   Eating: Slight. I have urges but I can control them.   Doing unnecessary things like emptying drawers/closets in bedroom/garage and leaving things in a mess: Mild. Impulsive behaviors occur occasionally but have not caused a problem for me but may be a problem for my family.   Nighttime sleep: Slight. I may have difficulty falling asleep but once I fall asleep, I can sleep through the night.   Daytime sleepiness: Moderate. I have difficulty staying awake during the day and may take a nap.   Vivid dreams: dreams that are so clear they seem real. Slight. I have them occasionally, but they don?t bother me.   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 Mild. I frequently talk in my sleep or act out my dreams, but this has not been disturbing for me or my sleep partner.   Restless legs syndrome: uncomfortable sensations in your legs that are uncontrollable and occur when resting, in the evenings, or when you get in bed. Moderate. I have these sensations occasionally at night or  while in bed, but they generally do not affect my sleeping.   Pain or muscle cramps: Marked. I have pain in many areas of my body, it limits my activities and may require medication.   Urination: Moderate. I have occasional accidents because I can?t make it to the bathroom in time.   Constipation: Moderate. I have occasional constipation but can manage it with over-the-counter medication.   Dizziness or lightheadedness: Marked. I feel like I am going to pass out when I am standing or walking.   Tiredness/Fatigue: Marked. I am tired most of the time and cannot do the majority of my daily activities.   Falling: Moderate. I fall occasionally.   Typically, how many times per month? 8   Personal Care: Moderate. I have some difficulty and need some help daily.   Assistive devices for getting around: None   Are you taking any form of levodopa ? Yes   Do you have OFF time while you are awake during the day? OFF time is time during the day when your Parkinson's medications are not working or not working as well as usual. Yes, I have OFF time   If yes, how many hours per day? 10   If yes, how many episodes per day? 10   Do you have dyskinesia while you are awake during the day? Dyskinesia refers to involuntary movements that can cause wiggling or twisting movements of the head, hands, legs, trunk, face, jaw, or any other part of the body. Yes   If yes, how many hours per day? 20   If yes, how many episodes per day? 5   Employment: Retired - not due to [employer].             Medications:   Current Outpatient Medications on File Prior to Visit   Medication Sig Dispense Refill    albuterol 0.083% (PROVENTIL; VENTOLIN) 2.5 mg /3 mL (0.083 %) nebulizer solution Inhale 3 mL solution by nebulizer as directed every 6 hours as needed.      albuterol-ipratropium (DUONEB) 0.5 mg-3 mg(2.5 mg base)/3 mL nebulizer solution Inhale  solution by nebulizer as directed every 4 hours as needed.      beclomethasone(+) (QVAR) 80 mcg/actuation inhaler Inhale two puffs by mouth into the lungs twice daily.      losartan (COZAAR) 25 mg tablet Take one tablet by mouth daily. (Patient not taking: Reported on 02/23/2024)      nitroglycerin (NITROSTAT) 0.4 mg tablet Place 1 tablet under tongue every 5 minutes as needed for Chest Pain. Max of 3 tablets, call 911. 25 tablet 3    rOPINIRole (REQUIP) 4 mg tablet Take one tablet by mouth three times daily.       No current facility-administered medications on file prior to visit.       Past Medical History:    Past Medical History:    Abnormal involuntary movement    Acid reflux    Arthritis    B12 deficiency    Blurry vision    CAD (coronary artery disease)    Cancer (CMS-HCC)    Chest pain COPD (chronic obstructive pulmonary disease) (CMS-HCC)    Dizziness    Generalized headaches    HA (headache)    Hypertension    Memory loss    Movement disorder    Other dysphagia    Parkinson's disease (CMS-HCC)    Peripheral neuropathy    Seasonal allergies    Sleep disorder    Smoker  Vision decreased        Social History:    Social History     Socioeconomic History    Marital status: Married   Tobacco Use    Smoking status: Every Day     Current packs/day: 0.25     Average packs/day: 0.3 packs/day for 46.7 years (12.0 ttl pk-yrs)     Types: Cigarettes, Cigars    Smokeless tobacco: Former     Types: Chew     Quit date: 06/14/1975    Tobacco comments:     at heaviest, smoked 2.5 ppd   Substance and Sexual Activity    Alcohol use: Yes     Alcohol/week: 2.0 standard drinks of alcohol     Types: 1 Glasses of wine, 1 Cans of beer per week     Comment: every night    Drug use: Never    Sexual activity: Not Currently     Partners: Female     Birth control/protection: None       Family History:   Family History   Problem Relation Name Age of Onset    Pacemaker Mother      Pacemaker Father      Stroke Father  70    Diabetes Brother      Cancer Brother          Allergies:   Allergies   Allergen Reactions    Codeine STOMACH UPSET and VOMITING       PHYSICAL EXAMINATION:      VITAL SIGNS:   Vitals:    02/23/24 1448 02/23/24 1451   BP: 123/73 122/72  Comment: unable to stand for three minutes, stood for 2 minutes   BP Source: Arm, Left Upper Arm, Left Upper   Patient Position: Sitting Standing   Pulse:  104       Body mass index is 18.4 kg/m?Louis James    NEUROLOGICAL EXAM:  MS: Alert, awake, cooperative. Speech fluent without paraphasic errors. Able to provide accurate history, remote memory intact. Good fund of knowledge.  Sensory: reduced to LT, PP, temperature, vibration, proprioception below knees      Movement disorders exam  Mod hypophonia  Mod facial masking  R > L resting tremor  R > L postural and action tremor   R > L rigidity  R> L  bradykinesia with finger taps, supination/pronation, hand grips, or foot taps/stomps  No dystonic posturing  Stands up unassisted, using arms to push off  Posture very stooped   Gait is slow, shuffling, narrow based, unsteady with poor stride length and arm swing     UPDRS Motor:   Speech: 2 - Monotone, slurred but understandable- moderately impaired.  Facial Expression: 2 - Slight but definitely abnormal diminution of facial expression.  Tremor at Rest Jaw: 3 - Moderate in amplitude and present most of the time.  Tremor at Rest RUE: 3 - Moderate in amplitude and present most of the time.  Tremor at Rest LUE: 2 - Moderate in amplitude, but only intermittently present.  Tremor at Rest RLE: 2 - Moderate in amplitude, but only intermittently present.  Tremor at Rest LLE: 2 - Moderate in amplitude, but only intermittently present.  Action of Postural Tremor of Hands R: 2 - Moderate in amplitude, present with action.  Action of Postural Tremor of Hands L: 2 - Moderate in amplitude, present with action.  Rigidity NECK: 2 - Mild to moderate.  Rigidity RUE: 2 - Mild to moderate.  Rigidity LUE: 1 -  Slight or detectable only when activated by mirror or other movements.  Rigidity RLE: 2 - Mild to moderate  Rigidity LLE: 2 - Mild to moderate.  Finger Taps L: 1 - Mild slowing and/or reduction in amplitude (11-14/5 sec)  Hand Movements R: 2 - Moderately impaired. Definite and early fatiguing. May have occasional arrests in movement.  Hand Movements L: 1 - Mild slowing and/or reduction in amplitude.  Rapid Alternating Movement of Hands R: 2 - Moderately impaired. Definite and early fatiguing. May have occasional arrests in movement.  Rapid Alternating Movements of Hands L: 1 - Mild slowing and/or reduction in amplitude.  Leg Agility with Knee Bent R: 2 - Moderately impaired. Definite and early fatiguing.  Leg Agility with Knee Bent L: 2 - Moderately impaired. Definite and early fatiguing.  Arising From Chair: 2 - Pushes self up from arms of seat.  Posture: 3 - Severely stooped posture with kyphosis- can be moderately leaning to one side.  Gait: 2 - Walks with difficulty, but requires little or no assistance- may have some festination short steps, or propulsion.  Postural Stability: 0 - Normal  Body Bradykinesia and Hypokinesia: 2 - Mild degree of slowness and poverty of movement which is definitely abnormal. Alternatively, some reduced amplitude.  Total Motor Exam: 51      Labs/Imaging/Procedures:      Assessment/Plan:  Louis James is a 72 y.o. right handed male who presents today for evaluation of Parkinson's Disease symptom onset 2018 R hand tremor. Exam reveals a moderate - severe RUE resting tremor, mild rigidity/bradykinesia. Course c/b multiple orthopedic issues, h/o spinal cord injury, diffuse pain with gait disturbance , likely nausea from levodopa  with weight loss ( GI workup unremarkable)  Exam reveals significant neuropathy below knees , causing sensory ataxia     Continues to appear underdosed on levodopa  , with severe R > L tremor     Plan:  Increase carbidopa /levodopa  CR 50/200mg  from 1 tab 3x/day to 2-1-1 tabs 3x/day x 3 days, then 2-2-1 tabs 3x/day x 3 days, then cont with 2 tabs 3x/day    Reduce ropinirole 4 mg tabs from 1 tab 3x/day to  Week 1: 1 tab 2x/day  Week 2: 1 tab daily   STOP     Consider Deep Brain Stimulation Surgery   Referral to neuropsych testing and psychology   Return to clinic for OFF/ON testing     Physical therapy referral    Follow up next available for OFF/ON      ICD-9-CM ICD-10-CM    1. Parkinson's disease without dyskinesia or fluctuating manifestations (CMS-HCC)  332.0 G20.A1 carbidopa -levodopa  CR (SINEMET  CR) 50/200 mg tablet      AMB REFERRAL TO NEUROPSYCHOLOGY      AMB REFERRAL TO PSYCHOLOGY      AMB REFERRAL TO PHYSICAL THERAPY      2. Neuropathy  355.9 G62.9 carbidopa -levodopa  CR (SINEMET  CR) 50/200 mg tablet      AMB REFERRAL TO NEUROPSYCHOLOGY      AMB REFERRAL TO PSYCHOLOGY      AMB REFERRAL TO PHYSICAL THERAPY      3. Gait difficulty  781.2 R26.9 AMB REFERRAL TO NEUROPSYCHOLOGY      AMB REFERRAL TO PSYCHOLOGY      AMB REFERRAL TO PHYSICAL THERAPY            Alfonso Ruth, MD  Assistant Professor of Neurology  [facility]  [facility]        I spent  a total of 40 minutes on this patient's care on the day of their visit excluding time spent related to any billed procedures. This time includes face-to-face time with the patient as well as time spent documenting in the medical record, reviewing patient's records and tests, obtaining history, placing orders, communicating with other healthcare professionals, counseling the patient, family, or caregiver, and/or care coordination for the diagnoses above.

## 2024-02-24 DIAGNOSIS — R269 Unspecified abnormalities of gait and mobility: Secondary | ICD-10-CM

## 2024-04-16 IMAGING — MR SPCERVWO
6 of 9 series · 27 of 48 positions shown · non-contrast
Comparison: none

[Series 5: T2 · sagittal · 3.0mm · 0.69mm/px · 3 of 17 slices shown (1 of 2)]
[im 1/17]
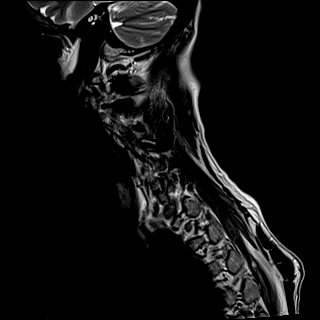
[im 9/17]
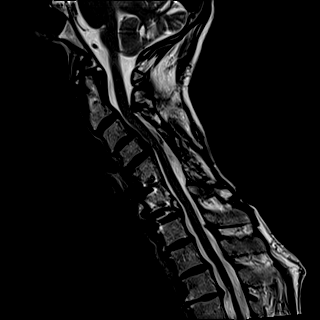
[im 17/17]
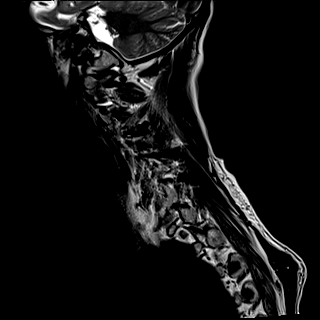

[Series 6: T1 · sagittal · 3.0mm · 0.43mm/px · 3 of 17 slices shown (1 of 2)]
[im 1/17]
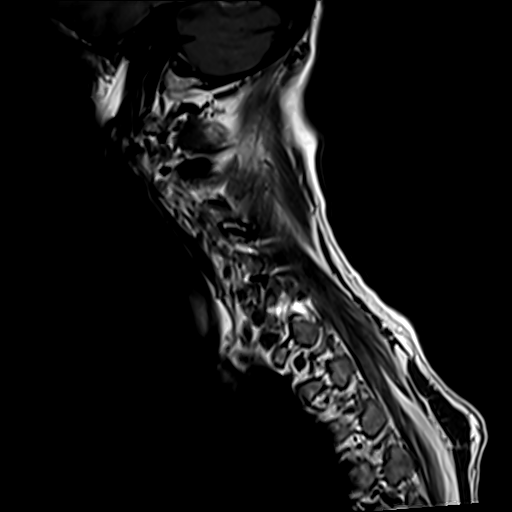
[im 9/17]
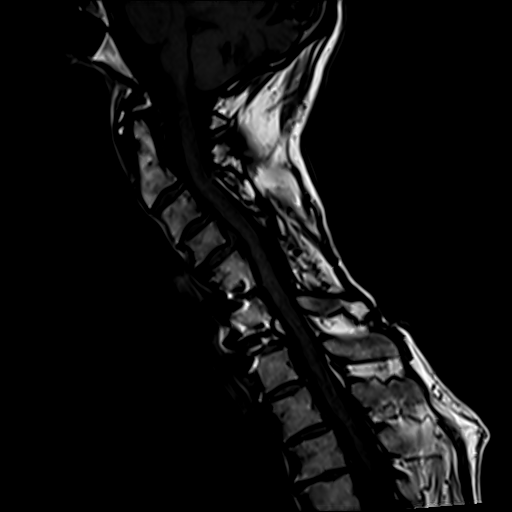
[im 17/17]
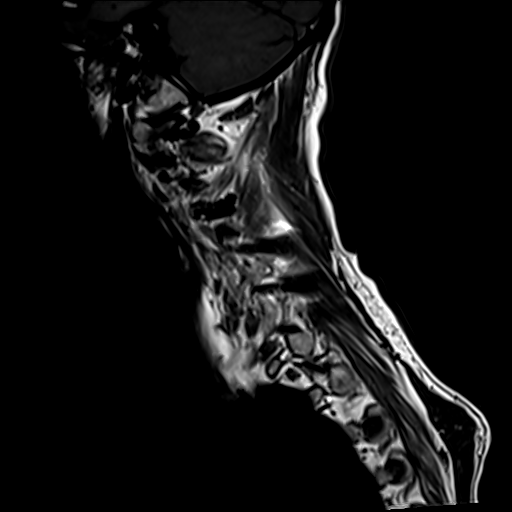

[Series 7: STIR · sagittal · 3.0mm · 0.86mm/px · 3 of 17 slices shown]
[im 1/17]
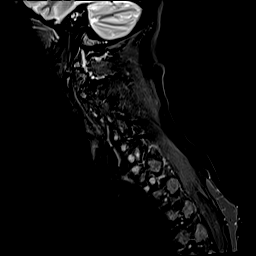
[im 9/17]
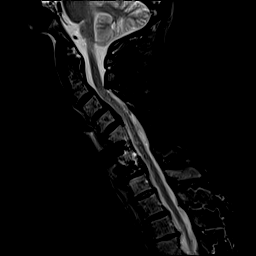
[im 17/17]
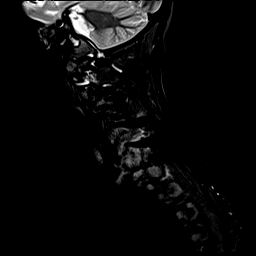

[Series 8: T2 · axial · 3.0mm · 0.70mm/px · z∈[-134,-35]mm · 6 of 35 slices shown (2 of 2)]
[im 1/35]
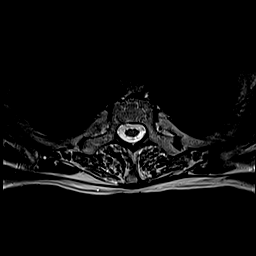
[im 7/35]
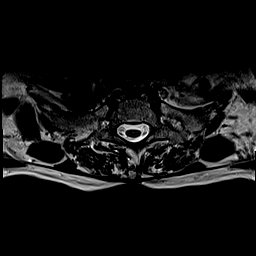
[im 14/35]
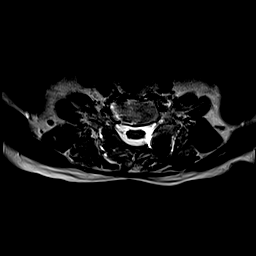
[im 21/35]
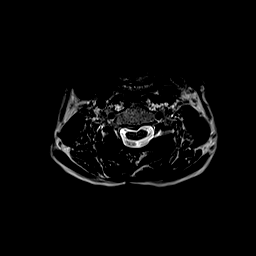
[im 28/35]
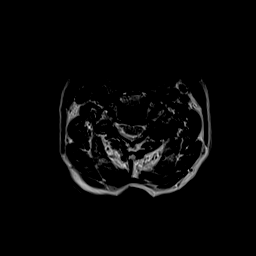
[im 35/35]
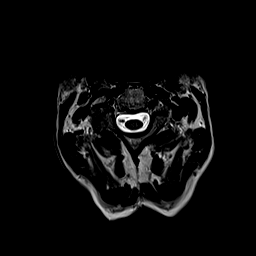

[Series 9: GRE · axial · 3.0mm · 0.47mm/px · z∈[-134,-35]mm · 6 of 35 slices shown]
[im 1/35]
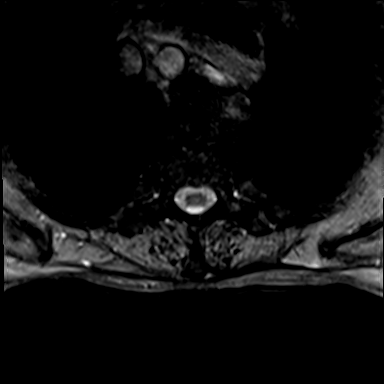
[im 7/35]
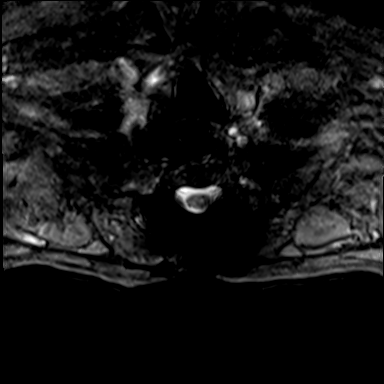
[im 14/35]
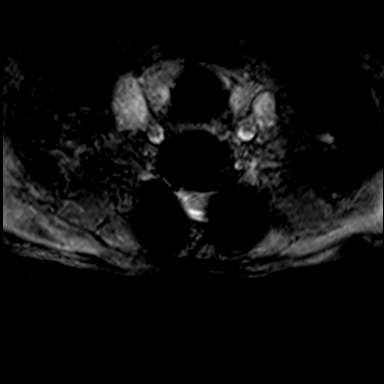
[im 21/35]
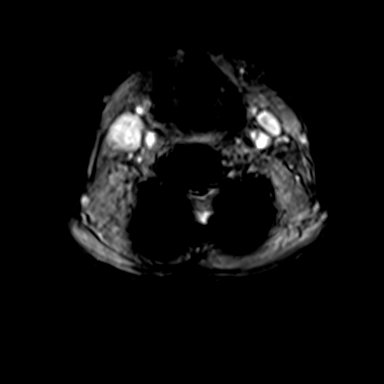
[im 28/35]
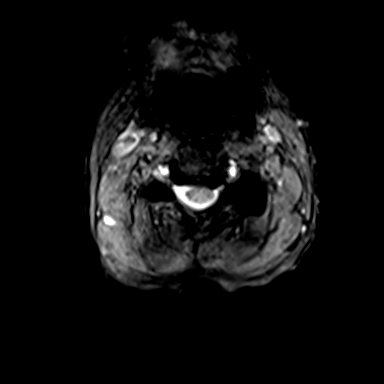
[im 35/35]
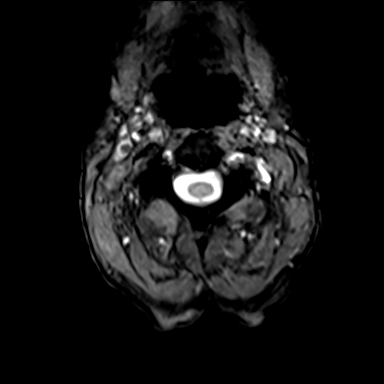

[Series 10: T1 · axial · 3.0mm · 0.35mm/px · z∈[-134,-35]mm · 6 of 35 slices shown (2 of 2)]
[im 1/35]
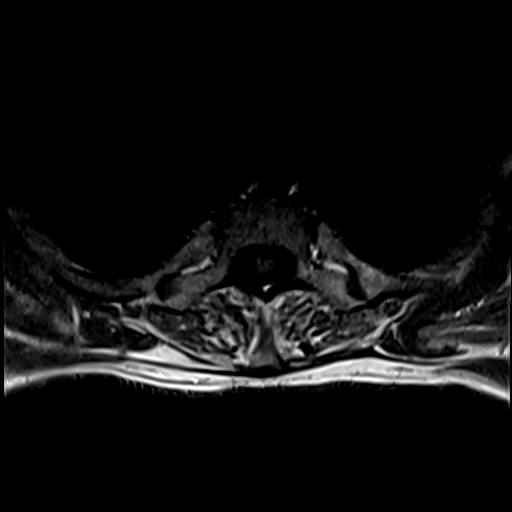
[im 7/35]
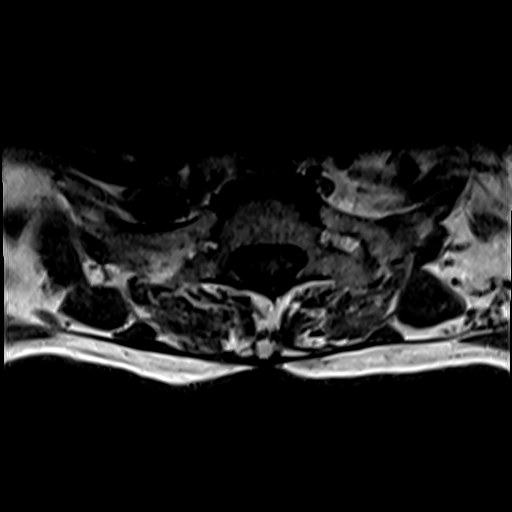
[im 14/35]
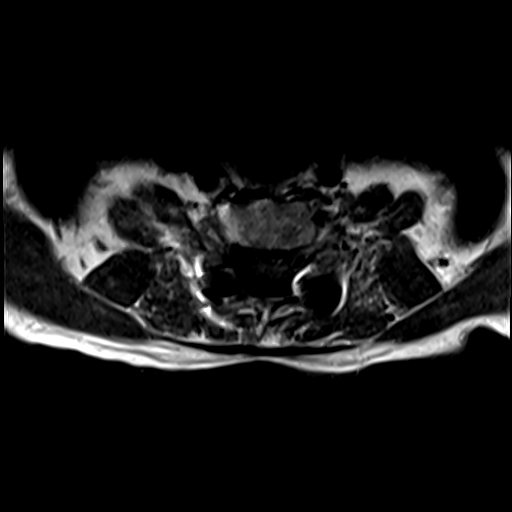
[im 21/35]
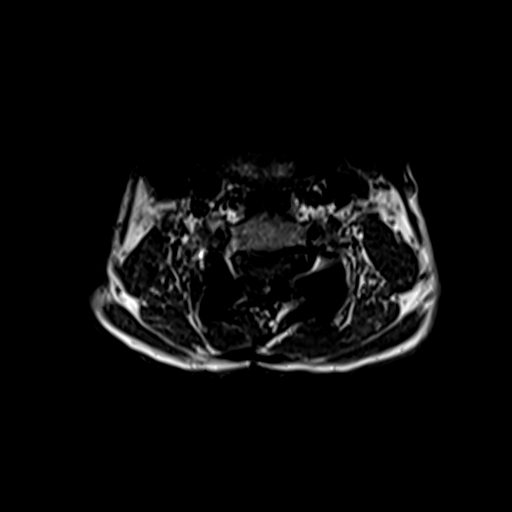
[im 28/35]
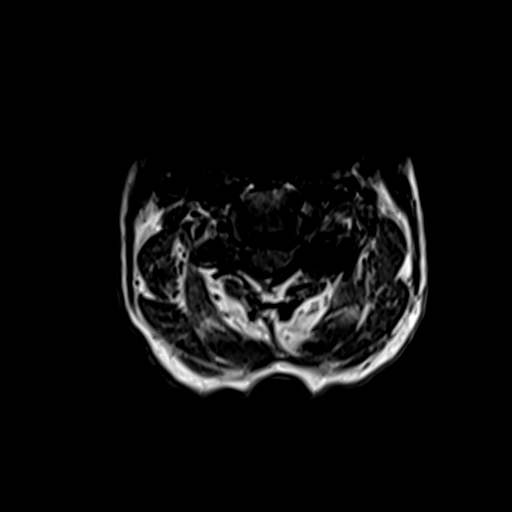
[im 35/35]
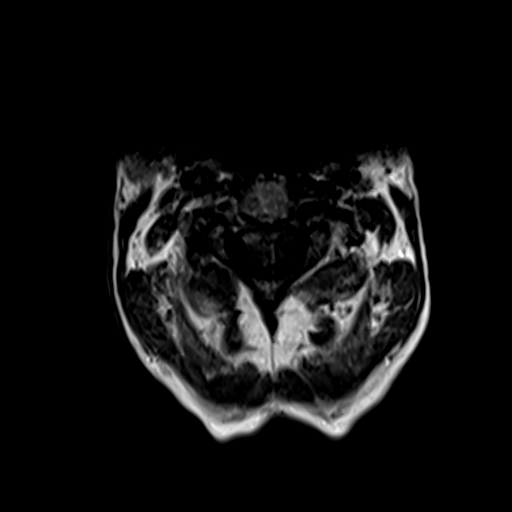

[27 of 48 positions shown; findings below may reference images not displayed]

DIAGNOSTIC STUDIES

EXAM

MR cervical spine wo con

INDICATION

Neck pain
CHRONIC PAIN TO NECK WITH PREVIOUS FX AND SURGICAL REPAIR.  RG

TECHNIQUE

Sagittal axial images were obtained with variable T1 and T2 weighting.

COMPARISONS

October 29, 2015.

FINDINGS

Small linear areas of T2 signal are identified within the cervical cord at C4 consistent with
minimal myelomalacia change. Degenerative changes at C1-2 are noted.

C2-3: Disc osteophyte complex is evident without central canal or neural foraminal stenosis.

C3-4: There is disc osteophyte complex greater on the left than right. There is moderate to severe
left bony neural foraminal stenosis. The right neural neural foramina in is minimally stenosed.

C4-5: There is reversal of the cervical lordosis at this level. There is prominent disc osteophyte
complex posteriorly causing deformity of the ventral cord and mild central canal narrowing. Severe
right bony neural foraminal stenosis is seen.

C5-6: Patient is status post anterior fusion at this level with near complete ankylosis of the disc.
No central canal stenosis is seen. Neural foramina are generally well preserved.

C6-7: Patient also appears to be status post fusion at this level. There is mild bilateral bony
neural foraminal stenosis.

C6-7: Slight disc osteophyte complex is seen at this level with mild bilateral neural foraminal
stenosis.

Posterior laminectomy is evident with posterior rod fixation from C5 through 7.

IMPRESSION

Extensive postop changes cervical spine. There is disc osteophyte complex and uncovertebral
hypertrophy at multiple levels resulting in varying degrees of central canal and neural foraminal
stenosis. Please see above discussion for individual levels.

Minimal myelomalacia changes within the cervical cord.

Tech Notes:

CHRONIC PAIN TO NECK WITH PREVIOUS FX AND SURGICAL REPAIR.  RG
